# Patient Record
Sex: Female | Born: 2000 | Race: White | Hispanic: No | Marital: Single | Smoking: Current every day smoker
Health system: Southern US, Community
[De-identification: ages and names within clinical notes are randomized; demographics above are authoritative.]

## PROBLEM LIST (undated history)

## (undated) DIAGNOSIS — F913 Oppositional defiant disorder: Secondary | ICD-10-CM

## (undated) DIAGNOSIS — R51 Headache: Secondary | ICD-10-CM

## (undated) DIAGNOSIS — F909 Attention-deficit hyperactivity disorder, unspecified type: Secondary | ICD-10-CM

## (undated) DIAGNOSIS — F980 Enuresis not due to a substance or known physiological condition: Secondary | ICD-10-CM

## (undated) HISTORY — DX: Attention-deficit hyperactivity disorder, unspecified type: F90.9

## (undated) HISTORY — DX: Headache: R51

## (undated) HISTORY — PX: DILATION AND CURETTAGE OF UTERUS: SHX78

## (undated) HISTORY — DX: Enuresis not due to a substance or known physiological condition: F98.0

## (undated) HISTORY — DX: Oppositional defiant disorder: F91.3

---
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
## 2021-03-30 ENCOUNTER — Emergency Department (HOSPITAL_COMMUNITY)
Admission: EM | Admit: 2021-03-30 | Discharge: 2021-03-31 | Disposition: A | Discharge status: Home / Self Care | Payer: Medicaid Other | Attending: Emergency Medicine | Admitting: Emergency Medicine

## 2021-03-30 ENCOUNTER — Encounter (HOSPITAL_COMMUNITY): Payer: Self-pay

## 2021-03-30 ENCOUNTER — Other Ambulatory Visit: Payer: Self-pay

## 2021-03-30 DIAGNOSIS — Y900 Blood alcohol level of less than 20 mg/100 ml: Secondary | ICD-10-CM | POA: Diagnosis not present

## 2021-03-30 DIAGNOSIS — F32A Depression, unspecified: Secondary | ICD-10-CM

## 2021-03-30 DIAGNOSIS — J45909 Unspecified asthma, uncomplicated: Secondary | ICD-10-CM | POA: Diagnosis not present

## 2021-03-30 DIAGNOSIS — Z5900 Homelessness unspecified: Secondary | ICD-10-CM | POA: Diagnosis not present

## 2021-03-30 DIAGNOSIS — F17210 Nicotine dependence, cigarettes, uncomplicated: Secondary | ICD-10-CM | POA: Insufficient documentation

## 2021-03-30 DIAGNOSIS — F330 Major depressive disorder, recurrent, mild: Secondary | ICD-10-CM | POA: Insufficient documentation

## 2021-03-30 DIAGNOSIS — R45851 Suicidal ideations: Secondary | ICD-10-CM | POA: Insufficient documentation

## 2021-03-30 NOTE — ED Provider Notes (Signed)
[facility] EMERGENCY DEPARTMENT Provider Note   CSN: [id number] Arrival date & time: 03/30/21  2156     History Chief Complaint  Patient presents with   V70.1    Joanna Tapia is a 20 y.o. female.  The history is provided by the patient.  She has history of attention deficit disorder, oppositional defiant disorder and comes in because of suicidal thoughts.  She states that she is homeless and had some vague suicidal thoughts earlier today.  She did not have specific suicidal plan but just feels that there is no place that she can turn.  She does admit to marijuana use but denies other drug use.  She does admit to depression and has had crying spells, early morning wakening, anhedonia.  She denies hallucinations.   Past Medical History:  Diagnosis Date   ADHD (attention deficit hyperactivity disorder)    Asthma    outgrown it   Headache(784.0)    Nonorganic enuresis    Oppositional defiant disorder     Patient Active Problem List   Diagnosis Date Noted   Nonorganic enuresis 10/16/2012   Anxiety as acute reaction to exceptional stress 08/13/2012   Unspecified episodic mood disorder 08/22/2011   ADHD (attention deficit hyperactivity disorder), combined type 08/22/2011   ODD (oppositional defiant disorder) 08/22/2011    History reviewed. No pertinent surgical history.   OB History     Gravida  1   Para  0   Term  0   Preterm  0   AB  0   Living  0      SAB  0   IAB  0   Ectopic  0   Multiple  0   Live Births  0           Family History  Problem Relation Age of Onset   Bipolar disorder Mother    Drug abuse Maternal Uncle    ADD / ADHD Cousin    OCD Cousin    Alcohol abuse Neg Hx    Anxiety disorder Neg Hx    Dementia Neg Hx    Depression Neg Hx    Paranoid behavior Neg Hx    Schizophrenia Neg Hx    Seizures Neg Hx    Sexual abuse Neg Hx    Physical abuse Neg Hx     Social History   Tobacco Use   Smoking status: Every Day    Pack  years: 0.00    Types: Cigarettes, E-cigarettes   Smokeless tobacco: Never  Vaping Use   Vaping Use: Some days  Substance Use Topics   Alcohol use: No   Drug use: Yes    Types: Marijuana    Home Medications Prior to Admission medications   Medication Sig Start Date End Date Taking? Authorizing Provider  cloNIDine (CATAPRES) 0.1 MG tablet Take 0.1 mg by mouth at bedtime.    [provider]  lisdexamfetamine (VYVANSE) 70 MG capsule Take 70 mg by mouth daily.    [provider]    Allergies    Patient has no known allergies.  Review of Systems   Review of Systems  All other systems reviewed and are negative.  Physical Exam Updated Vital Signs BP 117/62 (BP Location: Right Arm)   Pulse 77   Temp 98.4 F (36.9 C) (Oral)   Resp 18   Ht 5\' 10"  (1.778 m)   Wt 67.6 kg   LMP 03/30/2021 (Exact Date)   SpO2 98%   BMI  21.38 kg/m   Physical Exam Vitals and nursing note reviewed.  20 year old female, resting comfortably and in no acute distress. Vital signs are normal. Oxygen saturation is 98%, which is normal. Head is normocephalic and atraumatic. PERRLA, EOMI. Oropharynx is clear. Neck is nontender and supple without adenopathy or JVD. Back is nontender and there is no CVA tenderness. Lungs are clear without rales, wheezes, or rhonchi. Chest is nontender. Heart has regular rate and rhythm without murmur. Abdomen is soft, flat, nontender without masses or hepatosplenomegaly and peristalsis is normoactive. Extremities have no cyanosis or edema, full range of motion is present. Skin is warm and dry without rash. Neurologic: Mental status is normal, cranial nerves are intact, there are no motor or sensory deficits. Psychiatric: Depressed affect.  ED Results / Procedures / Treatments   Labs (all labs ordered are listed, but only abnormal results are displayed) Labs Reviewed  COMPREHENSIVE METABOLIC PANEL - Abnormal; Notable for the following components:       Result Value   AST 13 (*)    All other components within normal limits  SALICYLATE LEVEL - Abnormal; Notable for the following components:   Salicylate Lvl <7.0 (*)    All other components within normal limits  ACETAMINOPHEN LEVEL - Abnormal; Notable for the following components:   Acetaminophen (Tylenol), Serum <10 (*)    All other components within normal limits  CBC - Abnormal; Notable for the following components:   Hemoglobin 11.9 (*)    All other components within normal limits  RESP PANEL BY RT-PCR (FLU A&B, COVID) ARPGX2  ETHANOL  PREGNANCY, URINE  RAPID URINE DRUG SCREEN, HOSP PERFORMED   Procedures Procedures   Medications Ordered in ED Medications  nicotine (NICODERM CQ - dosed in mg/24 hours) patch 21 mg (has no administration in time range)  alum & mag hydroxide-simeth (MAALOX/MYLANTA) 200-200-20 MG/5ML suspension 30 mL (has no administration in time range)  ondansetron (ZOFRAN) tablet 4 mg (has no administration in time range)  acetaminophen (TYLENOL) tablet 650 mg (has no administration in time range)  cloNIDine (CATAPRES) tablet 0.1 mg (0.1 mg Oral Given 03/31/21 0030)  lisdexamfetamine (VYVANSE) capsule 70 mg (has no administration in time range)  hydrOXYzine (ATARAX/VISTARIL) tablet 50 mg (50 mg Oral Given 03/31/21 0030)    ED Course  I have reviewed the triage vital signs and the nursing notes.  Pertinent lab results that were available during my care of the patient were reviewed by me and considered in my medical decision making (see chart for details).   MDM Rules/Calculators/A&P                         Major depression with suicidal ideation.  Old records are reviewed, and she has no relevant past visits.  She will be held in the ED for psychiatric evaluation.  Patient wanted to sign out AMA, and is placed under involuntary commitment pending psychiatric evaluation.  Labs are unremarkable.  TTS consultation is appreciated.  Patient does not meet inpatient  criteria, but does need outpatient resources which were provided.  Involuntary commitment is rescinded.  Final Clinical Impression(s) / ED Diagnoses Final diagnoses:  Depression, unspecified depression type  Homeless    Rx / DC Orders ED Discharge Orders     None        Dione Booze, MD 03/31/21 [phone]

## 2021-03-30 NOTE — ED Notes (Signed)
Pt has been wanded by security in triage
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
## 2021-03-30 NOTE — ED Notes (Signed)
Pt refused blood work  

## 2021-03-31 LAB — COMPREHENSIVE METABOLIC PANEL
ALT: 11 U/L (ref 0–44)
AST: 13 U/L — ABNORMAL LOW (ref 15–41)
Albumin: 4.4 g/dL (ref 3.5–5.0)
Alkaline Phosphatase: 51 U/L (ref 38–126)
Anion gap: 5 (ref 5–15)
BUN: 8 mg/dL (ref 6–20)
CO2: 24 mmol/L (ref 22–32)
Calcium: 9 mg/dL (ref 8.9–10.3)
Chloride: 109 mmol/L (ref 98–111)
Creatinine, Ser: 0.66 mg/dL (ref 0.44–1.00)
GFR, Estimated: >60 mL/min (ref >60)
Glucose, Bld: 97 mg/dL (ref 70–99)
Potassium: 3.8 mmol/L (ref 3.5–5.1)
Sodium: 138 mmol/L (ref 135–145)
Total Bilirubin: 0.5 mg/dL (ref 0.3–1.2)
Total Protein: 7.6 g/dL (ref 6.5–8.1)

## 2021-03-31 LAB — CBC
HCT: 37.6 % (ref 36.0–46.0)
Hemoglobin: 11.9 g/dL — ABNORMAL LOW (ref 12.0–15.0)
MCH: 28.5 pg (ref 26.0–34.0)
MCHC: 31.6 g/dL (ref 30.0–36.0)
MCV: 90.2 fL (ref 80.0–100.0)
Platelets: 296 10*3/uL (ref 150–400)
RBC: 4.17 MIL/uL (ref 3.87–5.11)
RDW: 13.9 % (ref 11.5–15.5)
WBC: 9.4 10*3/uL (ref 4.0–10.5)
nRBC: 0 % (ref 0.0–0.2)

## 2021-03-31 LAB — SALICYLATE LEVEL: Salicylate Lvl: <7 mg/dL — ABNORMAL LOW (ref 7.0–30.0)

## 2021-03-31 LAB — ETHANOL: Alcohol, Ethyl (B): <10 mg/dL (ref <10)

## 2021-03-31 LAB — ACETAMINOPHEN LEVEL: Acetaminophen (Tylenol), Serum: <10 ug/mL — ABNORMAL LOW (ref 10–30)

## 2021-03-31 LAB — PREGNANCY, URINE: Preg Test, Ur: NEGATIVE

## 2021-03-31 MED ORDER — ACETAMINOPHEN 325 MG PO TABS: 650.0000 mg | ORAL_TABLET | ORAL | Status: DC | PRN

## 2021-03-31 MED ORDER — HYDROXYZINE HCL 25 MG PO TABS
50.0000 mg | ORAL_TABLET | Freq: Once | ORAL | Status: AC
  Administered 2021-03-31: 50 mg via ORAL
  Filled 2021-03-31: qty 2

## 2021-03-31 MED ORDER — ALUM & MAG HYDROXIDE-SIMETH 200-200-20 MG/5ML PO SUSP: 30.0000 mL | Freq: Four times a day (QID) | ORAL | Status: DC | PRN

## 2021-03-31 MED ORDER — ONDANSETRON HCL 4 MG PO TABS: 4.0000 mg | ORAL_TABLET | Freq: Three times a day (TID) | ORAL | Status: DC | PRN

## 2021-03-31 MED ORDER — NICOTINE 21 MG/24HR TD PT24: 21.0000 mg | MEDICATED_PATCH | Freq: Every day | TRANSDERMAL | Status: DC

## 2021-03-31 MED ORDER — CLONIDINE HCL 0.1 MG PO TABS
0.1000 mg | ORAL_TABLET | Freq: Every day | ORAL | Status: DC
  Administered 2021-03-31: 0.1 mg via ORAL
  Filled 2021-03-31: qty 1

## 2021-03-31 MED ORDER — LISDEXAMFETAMINE DIMESYLATE 50 MG PO CAPS: 70.0000 mg | ORAL_CAPSULE | Freq: Every day | ORAL | Status: DC

## 2021-03-31 NOTE — ED Notes (Signed)
[facility] here to serve IVC paperwork. Explained IVC to pt.
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
## 2021-04-24 ENCOUNTER — Other Ambulatory Visit: Payer: Self-pay

## 2021-04-24 ENCOUNTER — Encounter: Payer: Self-pay | Admitting: Obstetrics & Gynecology

## 2021-04-24 ENCOUNTER — Other Ambulatory Visit (HOSPITAL_COMMUNITY)
Admission: RE | Admit: 2021-04-24 | Discharge: 2021-04-24 | Disposition: A | Discharge status: Home / Self Care | Payer: Medicaid Other | Source: Ambulatory Visit | Attending: Obstetrics & Gynecology | Admitting: Obstetrics & Gynecology

## 2021-04-24 ENCOUNTER — Ambulatory Visit (INDEPENDENT_AMBULATORY_CARE_PROVIDER_SITE_OTHER): Payer: Medicaid Other | Admitting: Obstetrics & Gynecology

## 2021-04-24 VITALS — BP 128/77 | HR 107 | Ht 70.0 in | Wt 147.0 lb

## 2021-04-24 DIAGNOSIS — N730 Acute parametritis and pelvic cellulitis: Secondary | ICD-10-CM | POA: Insufficient documentation

## 2021-04-24 DIAGNOSIS — A5901 Trichomonal vulvovaginitis: Secondary | ICD-10-CM

## 2021-04-24 MED ORDER — METRONIDAZOLE 500 MG PO TABS: 500.0000 mg | ORAL_TABLET | Freq: Two times a day (BID) | ORAL | 1 refills | Status: DC

## 2021-04-24 MED ORDER — DOXYCYCLINE HYCLATE 100 MG PO TABS
100.0000 mg | ORAL_TABLET | Freq: Two times a day (BID) | ORAL | 0 refills | Status: DC
Start: 2021-04-24 — End: 2022-02-24

## 2021-04-24 MED ORDER — CEFTRIAXONE SODIUM 1 G IJ SOLR
1.0000 g | Freq: Once | INTRAMUSCULAR | Status: AC
  Administered 2021-04-24: 1 g via INTRAMUSCULAR

## 2021-04-24 NOTE — Addendum Note (Signed)
Addended by: Annamarie Dawley on: 04/24/2021 04:42 PM   Modules accepted: Orders
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
## 2021-04-24 NOTE — Progress Notes (Signed)
Rocephin 1 g given IM in left upper outer gluteal. Patient tolerated well.

## 2021-04-24 NOTE — Addendum Note (Signed)
Addended by: Annamarie Dawley on: 04/24/2021 04:43 PM   Modules accepted: Orders

## 2021-04-26 LAB — CERVICOVAGINAL ANCILLARY ONLY
Chlamydia: POSITIVE — AB
Comment: NEGATIVE
Comment: NORMAL
Neisseria Gonorrhea: NEGATIVE

## 2021-05-22 ENCOUNTER — Ambulatory Visit: Payer: Medicaid Other | Admitting: Obstetrics & Gynecology

## 2022-02-23 ENCOUNTER — Other Ambulatory Visit: Payer: Self-pay

## 2022-02-23 ENCOUNTER — Encounter (HOSPITAL_COMMUNITY): Payer: Self-pay

## 2022-02-23 DIAGNOSIS — K029 Dental caries, unspecified: Secondary | ICD-10-CM | POA: Diagnosis not present

## 2022-02-23 DIAGNOSIS — K0889 Other specified disorders of teeth and supporting structures: Secondary | ICD-10-CM | POA: Diagnosis present

## 2022-02-23 NOTE — ED Triage Notes (Signed)
Pt arrived via POV c/o lower right dental pain. Pt reports having a dental appointment scheduled for next week, but feels the broken teeth may be infected and pain is unbearable. Multiple at home remedies have been tried w/o success.

## 2022-02-24 ENCOUNTER — Emergency Department (HOSPITAL_COMMUNITY)
Admission: EM | Admit: 2022-02-24 | Discharge: 2022-02-24 | Disposition: A | Discharge status: Home / Self Care | Payer: Medicaid Other | Attending: Emergency Medicine | Admitting: Emergency Medicine

## 2022-02-24 DIAGNOSIS — K029 Dental caries, unspecified: Secondary | ICD-10-CM

## 2022-02-24 MED ORDER — PENICILLIN V POTASSIUM 250 MG PO TABS
500.0000 mg | ORAL_TABLET | Freq: Once | ORAL | Status: AC
  Administered 2022-02-24: 500 mg via ORAL
  Filled 2022-02-24: qty 2

## 2022-02-24 MED ORDER — HYDROCODONE-ACETAMINOPHEN 5-325 MG PO TABS
1.0000 | ORAL_TABLET | Freq: Once | ORAL | Status: AC
  Administered 2022-02-24: 1 via ORAL
  Filled 2022-02-24: qty 1

## 2022-02-24 MED ORDER — PENICILLIN V POTASSIUM 500 MG PO TABS: 500.0000 mg | ORAL_TABLET | Freq: Four times a day (QID) | ORAL | 0 refills | Status: AC

## 2022-02-24 NOTE — ED Provider Notes (Signed)
  [facility] EMERGENCY DEPARTMENT Provider Note   CSN: [id number] Arrival date & time: 02/23/22  2238     History  Chief Complaint  Patient presents with   Dental Pain    Joanna Tapia is a 21 y.o. female.  The history is provided by the patient.  Dental Pain Location:  Lower Timing:  Constant Progression:  Worsening Chronicity:  New Relieved by:  Nothing Worsened by:  Jaw movement Associated symptoms: no fever   Patient reports right lower jaw pain and tooth pain, now having ear pain and headache.  No fevers are reported    Home Medications Prior to Admission medications   Medication Sig Start Date End Date Taking? Authorizing Provider  penicillin v potassium (VEETID) 500 MG tablet Take 1 tablet (500 mg total) by mouth 4 (four) times daily for 7 days. 02/24/22 03/03/22 Yes Ripley Fraise, MD      Allergies    Patient has no known allergies.    Review of Systems   Review of Systems  Constitutional:  Negative for fever.  HENT:  Positive for dental problem.    Physical Exam Updated Vital Signs BP 132/88 (BP Location: Right Arm)   Pulse 78   Temp 97.8 F (36.6 C) (Oral)   Resp 16   Ht 1.778 m (5\' 10" )   Wt 66 kg   LMP 02/23/2022 (Exact Date)   SpO2 100%   BMI 20.88 kg/m  Physical Exam CONSTITUTIONAL: Well developed/well nourished HEAD AND FACE: Normocephalic/atraumatic EYES: EOMI/PERRL ENMT: Mucous membranes moist.  Poor dentition.  No trismus.  No focal abscess noted. NECK: supple no meningeal signs NEURO: Pt is awake/alert, moves all extremitiesx4 EXTREMITIES:full ROM SKIN: warm, color normal  ED Results / Procedures / Treatments   Labs (all labs ordered are listed, but only abnormal results are displayed) Labs Reviewed - No data to display  EKG None  Radiology No results found.  Procedures Procedures    Medications Ordered in ED Medications  penicillin v potassium (VEETID) tablet 500 mg (500 mg Oral Given 02/24/22 0028)   HYDROcodone-acetaminophen (NORCO/VICODIN) 5-325 MG per tablet 1 tablet (1 tablet Oral Given 02/24/22 0028)    ED Course/ Medical Decision Making/ A&P                           Medical Decision Making Risk Prescription drug management.   Patient reports she has follow-up with dentistry next week.  We will start oral antibiotic        Final Clinical Impression(s) / ED Diagnoses Final diagnoses:  Dental caries    Rx / DC Orders ED Discharge Orders          Ordered    penicillin v potassium (VEETID) 500 MG tablet  4 times daily        02/24/22 Caren Hazy, MD 02/24/22 [phone]
